# Patient Record
Sex: Female | Born: 1975 | Race: Black or African American | Hispanic: No | Smoking: Former smoker
Health system: Southern US, Community
[De-identification: ages and names within clinical notes are randomized; demographics above are authoritative.]

## PROBLEM LIST (undated history)

## (undated) DIAGNOSIS — I10 Essential (primary) hypertension: Secondary | ICD-10-CM

---

## 2021-06-17 ENCOUNTER — Emergency Department
Admission: EM | Admit: 2021-06-17 | Discharge: 2021-06-17 | Disposition: A | Discharge status: Home / Self Care | Payer: 59 | Attending: Emergency Medicine | Admitting: Emergency Medicine

## 2021-06-17 ENCOUNTER — Emergency Department: Payer: 59

## 2021-06-17 ENCOUNTER — Other Ambulatory Visit: Payer: Self-pay

## 2021-06-17 ENCOUNTER — Encounter: Payer: Self-pay | Admitting: *Deleted

## 2021-06-17 ENCOUNTER — Ambulatory Visit: Admission: EM | Admit: 2021-06-17 | Discharge: 2021-06-17 | Payer: 59

## 2021-06-17 DIAGNOSIS — T59811A Toxic effect of smoke, accidental (unintentional), initial encounter: Secondary | ICD-10-CM

## 2021-06-17 DIAGNOSIS — R0602 Shortness of breath: Secondary | ICD-10-CM

## 2021-06-17 DIAGNOSIS — I10 Essential (primary) hypertension: Secondary | ICD-10-CM | POA: Insufficient documentation

## 2021-06-17 DIAGNOSIS — F17200 Nicotine dependence, unspecified, uncomplicated: Secondary | ICD-10-CM | POA: Insufficient documentation

## 2021-06-17 DIAGNOSIS — J705 Respiratory conditions due to smoke inhalation: Secondary | ICD-10-CM | POA: Diagnosis not present

## 2021-06-17 LAB — BLOOD GAS, VENOUS
Acid-Base Excess: 1.3 mmol/L (ref 0.0–2.0)
Bicarbonate: 26.6 mmol/L (ref 20.0–28.0)
O2 Saturation: 71.5 %
Patient temperature: 37
pCO2, Ven: 44 mmHg (ref 44.0–60.0)
pH, Ven: 7.39 (ref 7.250–7.430)
pO2, Ven: 38 mmHg (ref 32.0–45.0)

## 2021-06-17 LAB — BASIC METABOLIC PANEL
Anion gap: 9 (ref 5–15)
BUN: 10 mg/dL (ref 6–20)
CO2: 23 mmol/L (ref 22–32)
Calcium: 9.2 mg/dL (ref 8.9–10.3)
Chloride: 104 mmol/L (ref 98–111)
Creatinine, Ser: 0.73 mg/dL (ref 0.44–1.00)
GFR, Estimated: >60 mL/min (ref >60)
Glucose, Bld: 103 mg/dL — ABNORMAL HIGH (ref 70–99)
Potassium: 3.7 mmol/L (ref 3.5–5.1)
Sodium: 136 mmol/L (ref 135–145)

## 2021-06-17 LAB — CBC
HCT: 35.7 % — ABNORMAL LOW (ref 36.0–46.0)
Hemoglobin: 12.1 g/dL (ref 12.0–15.0)
MCH: 30.3 pg (ref 26.0–34.0)
MCHC: 33.9 g/dL (ref 30.0–36.0)
MCV: 89.3 fL (ref 80.0–100.0)
Platelets: 308 10*3/uL (ref 150–400)
RBC: 4 MIL/uL (ref 3.87–5.11)
RDW: 12.9 % (ref 11.5–15.5)
WBC: 7.9 10*3/uL (ref 4.0–10.5)
nRBC: 0 % (ref 0.0–0.2)

## 2021-06-17 LAB — TROPONIN I (HIGH SENSITIVITY): Troponin I (High Sensitivity): 5 ng/L (ref <18)

## 2021-06-17 LAB — LACTIC ACID, PLASMA: Lactic Acid, Venous: 0.9 mmol/L (ref 0.5–1.9)

## 2021-06-17 NOTE — Discharge Instructions (Addendum)
Your blood pressure was slightly elevated today.  This could be from stressful events earlier today but please have this rechecked by your primary care physician in the next 7 to 10 days.
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
## 2021-06-17 NOTE — Progress Notes (Signed)
Co-oximetry results did not cross over to Epic.   tHb: 12.4 COHb: 1.6 MetHb: 0.0 sO2: 71.5 Hct: 37  Results verbally given to MD

## 2021-06-18 LAB — COOXEMETRY PANEL
Carboxyhemoglobin: 1.6 % — ABNORMAL HIGH (ref 0.5–1.5)
Methemoglobin: 0 % (ref 0.0–1.5)
O2 Saturation: 71.5 %
Total hemoglobin: 12.4 g/dL (ref 12.0–16.0)
Total oxygen content: 37 mL/dL

## 2021-10-26 ENCOUNTER — Other Ambulatory Visit: Payer: Self-pay

## 2021-10-26 ENCOUNTER — Emergency Department: Payer: 59

## 2021-10-26 ENCOUNTER — Encounter: Payer: Self-pay | Admitting: Emergency Medicine

## 2021-10-26 ENCOUNTER — Emergency Department
Admission: EM | Admit: 2021-10-26 | Discharge: 2021-10-26 | Disposition: A | Discharge status: Home / Self Care | Payer: 59 | Attending: Student in an Organized Health Care Education/Training Program | Admitting: Student in an Organized Health Care Education/Training Program

## 2021-10-26 DIAGNOSIS — R0602 Shortness of breath: Secondary | ICD-10-CM | POA: Diagnosis not present

## 2021-10-26 DIAGNOSIS — I10 Essential (primary) hypertension: Secondary | ICD-10-CM | POA: Insufficient documentation

## 2021-10-26 DIAGNOSIS — R0789 Other chest pain: Secondary | ICD-10-CM | POA: Insufficient documentation

## 2021-10-26 DIAGNOSIS — R079 Chest pain, unspecified: Secondary | ICD-10-CM | POA: Diagnosis present

## 2021-10-26 HISTORY — DX: Essential (primary) hypertension: I10

## 2021-10-26 LAB — CBC WITH DIFFERENTIAL/PLATELET
Abs Immature Granulocytes: 0.03 10*3/uL (ref 0.00–0.07)
Basophils Absolute: 0 10*3/uL (ref 0.0–0.1)
Basophils Relative: 1 %
Eosinophils Absolute: 0.1 10*3/uL (ref 0.0–0.5)
Eosinophils Relative: 1 %
HCT: 36.2 % (ref 36.0–46.0)
Hemoglobin: 11.7 g/dL — ABNORMAL LOW (ref 12.0–15.0)
Immature Granulocytes: 0 %
Lymphocytes Relative: 32 %
Lymphs Abs: 2.4 10*3/uL (ref 0.7–4.0)
MCH: 29.5 pg (ref 26.0–34.0)
MCHC: 32.3 g/dL (ref 30.0–36.0)
MCV: 91.4 fL (ref 80.0–100.0)
Monocytes Absolute: 0.7 10*3/uL (ref 0.1–1.0)
Monocytes Relative: 10 %
Neutro Abs: 4.3 10*3/uL (ref 1.7–7.7)
Neutrophils Relative %: 56 %
Platelets: 289 10*3/uL (ref 150–400)
RBC: 3.96 MIL/uL (ref 3.87–5.11)
RDW: 13 % (ref 11.5–15.5)
WBC: 7.6 10*3/uL (ref 4.0–10.5)
nRBC: 0 % (ref 0.0–0.2)

## 2021-10-26 LAB — BRAIN NATRIURETIC PEPTIDE: B Natriuretic Peptide: 359.4 pg/mL — ABNORMAL HIGH (ref 0.0–100.0)

## 2021-10-26 LAB — TROPONIN I (HIGH SENSITIVITY)
Troponin I (High Sensitivity): 4 ng/L (ref <18)
Troponin I (High Sensitivity): 4 ng/L (ref <18)
Troponin I (High Sensitivity): 5 ng/L (ref <18)

## 2021-10-26 LAB — COMPREHENSIVE METABOLIC PANEL
ALT: 16 U/L (ref 0–44)
AST: 18 U/L (ref 15–41)
Albumin: 3.5 g/dL (ref 3.5–5.0)
Alkaline Phosphatase: 47 U/L (ref 38–126)
Anion gap: 6 (ref 5–15)
BUN: 11 mg/dL (ref 6–20)
CO2: 23 mmol/L (ref 22–32)
Calcium: 8.8 mg/dL — ABNORMAL LOW (ref 8.9–10.3)
Chloride: 107 mmol/L (ref 98–111)
Creatinine, Ser: 0.71 mg/dL (ref 0.44–1.00)
GFR, Estimated: >60 mL/min (ref >60)
Glucose, Bld: 129 mg/dL — ABNORMAL HIGH (ref 70–99)
Potassium: 4.1 mmol/L (ref 3.5–5.1)
Sodium: 136 mmol/L (ref 135–145)
Total Bilirubin: 0.6 mg/dL (ref 0.3–1.2)
Total Protein: 7.5 g/dL (ref 6.5–8.1)

## 2021-10-26 LAB — D-DIMER, QUANTITATIVE: D-Dimer, Quant: 0.43 ug/mL-FEU (ref 0.00–0.50)

## 2021-10-26 MED ORDER — SUCRALFATE 1 G PO TABS
1.0000 g | ORAL_TABLET | Freq: Once | ORAL | Status: AC
  Administered 2021-10-26: 1 g via ORAL
  Filled 2021-10-26: qty 1

## 2021-10-26 MED ORDER — HYDROCHLOROTHIAZIDE 12.5 MG PO TABS: 25.0000 mg | ORAL_TABLET | Freq: Every day | ORAL | 0 refills | Status: DC

## 2021-10-26 MED ORDER — PANTOPRAZOLE SODIUM 40 MG PO TBEC
40.0000 mg | DELAYED_RELEASE_TABLET | Freq: Once | ORAL | Status: AC
  Administered 2021-10-26: 40 mg via ORAL
  Filled 2021-10-26: qty 1

## 2021-10-26 MED ORDER — ALUM & MAG HYDROXIDE-SIMETH 200-200-20 MG/5ML PO SUSP
30.0000 mL | Freq: Once | ORAL | Status: AC
  Administered 2021-10-26: 30 mL via ORAL
  Filled 2021-10-26: qty 30

## 2021-10-26 MED ORDER — PANTOPRAZOLE SODIUM 20 MG PO TBEC
20.0000 mg | DELAYED_RELEASE_TABLET | Freq: Every day | ORAL | 1 refills | Status: AC
Start: 2021-10-26 — End: 2022-10-26

## 2021-10-26 MED ORDER — OXYCODONE-ACETAMINOPHEN 5-325 MG PO TABS
1.0000 | ORAL_TABLET | Freq: Once | ORAL | Status: AC
  Administered 2021-10-26: 1 via ORAL
  Filled 2021-10-26: qty 1

## 2021-10-26 MED ORDER — LIDOCAINE VISCOUS HCL 2 % MT SOLN
15.0000 mL | Freq: Once | OROMUCOSAL | Status: AC
  Administered 2021-10-26: 15 mL via ORAL
  Filled 2021-10-26: qty 15

## 2021-10-26 NOTE — ED Triage Notes (Signed)
Pt via POV from home. Pt c/o mid to R sided CP that started this AM. Pt also endorses SOB. Pain is non-radiating. Pt states this has happened before and they told her it was acid reflux but states this is worse. Pt is A&Ox4 and NAD

## 2021-10-26 NOTE — ED Provider Notes (Signed)
[facility] Provider Note    Event Date/Time   First MD Initiated Contact with Patient 10/26/21 1708     (approximate)   History   Chest Pain   HPI  Ashlee Adams is a 46 y.o. female presents the ER for evaluation of chest pain and pressure worse with taking deep inspiration nonradiating to her back or shoulders.  No fevers or chills.  Symptoms started this morning.  States she has had chest pain in the past was told it was acid reflux.  This feels different as compared to prior.  Never increasing stressors at home no history of heart disease or lung disease.     Physical Exam   Triage Vital Signs: ED Triage Vitals  Enc Vitals Group     BP 10/26/21 1552 (!) 145/81     Pulse Rate 10/26/21 1552 85     Resp 10/26/21 1552 18     Temp 10/26/21 1552 98.1 F (36.7 C)     Temp Source 10/26/21 1552 Oral     SpO2 10/26/21 1552 98 %     Weight 10/26/21 1550 210 lb (95.3 kg)     Height 10/26/21 1550 5\' 1"  (1.549 m)     Head Circumference --      Peak Flow --      Pain Score 10/26/21 1550 10     Pain Loc --      Pain Edu? --      Excl. in GC? --     Most recent vital signs: Vitals:   10/26/21 1552 10/26/21 1910  BP: (!) 145/81 121/63  Pulse: 85 70  Resp: 18 20  Temp: 98.1 F (36.7 C)   SpO2: 98% 98%     Constitutional: Alert  Eyes: Conjunctivae are normal.  Head: Atraumatic. Nose: No congestion/rhinnorhea. Mouth/Throat: Mucous membranes are moist.   Neck: Painless ROM.  Cardiovascular:   Good peripheral circulation. Respiratory: Normal respiratory effort.  No retractions.  Gastrointestinal: Soft and nontender.  Musculoskeletal:  no deformity Neurologic:  MAE spontaneously. No gross focal neurologic deficits are appreciated.  Skin:  Skin is warm, dry and intact. No rash noted. Psychiatric: Mood and affect are normal. Speech and behavior are normal.    ED Results / Procedures / Treatments   Labs (all labs ordered are listed, but  only abnormal results are displayed) Labs Reviewed  CBC WITH DIFFERENTIAL/PLATELET - Abnormal; Notable for the following components:      Result Value   Hemoglobin 11.7 (*)    All other components within normal limits  COMPREHENSIVE METABOLIC PANEL - Abnormal; Notable for the following components:   Glucose, Bld 129 (*)    Calcium 8.8 (*)    All other components within normal limits  BRAIN NATRIURETIC PEPTIDE - Abnormal; Notable for the following components:   B Natriuretic Peptide 359.4 (*)    All other components within normal limits  D-DIMER, QUANTITATIVE  TROPONIN I (HIGH SENSITIVITY)  TROPONIN I (HIGH SENSITIVITY)  TROPONIN I (HIGH SENSITIVITY)     EKG  ED ECG REPORT I, Willy EddyPatrick Oberon Hehir, the attending physician, personally viewed and interpreted this ECG.   Date: 10/26/2021  EKG Time: 15:51  Rate: 90  Rhythm: sinus  Axis: normal  Intervals:  normal  ST&T Change: no stemi, no depressions    RADIOLOGY Please see ED Course for my review and interpretation.  I personally reviewed all radiographic images ordered to evaluate for the above acute complaints and reviewed radiology reports and findings.  These findings were personally discussed with the patient.  Please see medical record for radiology report.    PROCEDURES:  Critical Care performed: No  Procedures   MEDICATIONS ORDERED IN ED: Medications  alum & mag hydroxide-simeth (MAALOX/MYLANTA) 200-200-20 MG/5ML suspension 30 mL (30 mLs Oral Given 10/26/21 1757)    And  lidocaine (XYLOCAINE) 2 % viscous mouth solution 15 mL (15 mLs Oral Given 10/26/21 1757)  oxyCODONE-acetaminophen (PERCOCET/ROXICET) 5-325 MG per tablet 1 tablet (1 tablet Oral Given 10/26/21 1755)  pantoprazole (PROTONIX) EC tablet 40 mg (40 mg Oral Given 10/26/21 2130)  sucralfate (CARAFATE) tablet 1 g (1 g Oral Given 10/26/21 2130)     IMPRESSION / MDM / ASSESSMENT AND PLAN / ED COURSE  I reviewed the triage vital signs and the nursing notes.                               Differential diagnosis includes, but is not limited to, ACS, pericarditis, esophagitis, pe, dissection, pna, bronchitis, costochondritis  Patient presents to the ER for evaluation of chest pain and pressure and some shortness of breath as described above.  Has history of heartburn feels somewhat different from previous episodes a little bit more severe.  States that she patient does have high blood pressure but is not on any blood pressure medications.  Does have some pain with deep inspiration.  No history of blood clots.  This presenting complaint could reflect a potentially life-threatening illness therefore the patient will be placed on continuous pulse oximetry and telemetry for monitoring.  Laboratory evaluation will be sent to evaluate for the above complaints.      Clinical Course as of 10/26/21 2133  Mon Oct 26, 2021  1759 Chest x-ray by my interpretation does not show any evidence of pneumothorax.  Does have cardiomegaly. [PR]  1938 Dimer is negative.  Blood pressure improved.  She is not hypoxic.  May have some mild CHF will add on BNP.  No anemia no leukocytosis. [PR]  2117 Patient able to ambulate down the hall and back without any hypoxia or shortness of breath.  She felt significant proved with GI cocktail.  May have some mild CHF but not in severe exacerbation with no hypoxia.  She has outpatient follow-up with her physician already scheduled for this week. [PR]  2128 Patient remains well-appearing in no acute distress.  Repeat troponin is negative.  This not consistent with ACS.  And states that she does have a history of high blood pressure and based on her chest x-ray findings I will put on HCTZ.  We discussed option for hospitalization for further work-up and monitoring but patient states that she feels comfortable saying her PCP and with close outpatient follow-up with cardiology which I think is reasonable given her reassuring work-up.  Discussed strict  return precautions.  Patient agreeable to plan. [PR]    Clinical Course User Index [PR] Willy Eddy, MD    Patient's presentation is most consistent with acute complicated illness / injury requiring diagnostic workup.   FINAL CLINICAL IMPRESSION(S) / ED DIAGNOSES   Final diagnoses:  Atypical chest pain     Rx / DC Orders   ED Discharge Orders          Ordered    Ambulatory referral to Cardiology       Comments: If you have not heard from the Cardiology office within the next 72 hours please call [phone].  10/26/21 2131    hydrochlorothiazide (HYDRODIURIL) 12.5 MG tablet  Daily        10/26/21 2131    pantoprazole (PROTONIX) 20 MG tablet  Daily        10/26/21 2131             Note:  This document was prepared using Dragon voice recognition software and may include unintentional dictation errors.    Willy Eddy, MD 10/26/21 2133

## 2021-10-27 ENCOUNTER — Ambulatory Visit: Payer: 59 | Admitting: Cardiovascular Disease

## 2021-10-27 ENCOUNTER — Encounter: Payer: Self-pay | Admitting: Cardiovascular Disease

## 2021-10-27 VITALS — BP 120/60 | HR 75 | Ht 61.0 in | Wt 209.0 lb

## 2021-10-27 DIAGNOSIS — R079 Chest pain, unspecified: Secondary | ICD-10-CM

## 2021-10-27 DIAGNOSIS — K219 Gastro-esophageal reflux disease without esophagitis: Secondary | ICD-10-CM

## 2021-10-27 NOTE — Progress Notes (Signed)
Cardiology Office Note  Date:  10/27/2021   ID:  Ashlee Adams, DOB 1976-02-26, MRN [id number]  PCP:  [facility]   Chief Complaint  Patient presents with   [facility] follow up     Patient c/o moderate chest pain/pressure and shortness of breath. Medications reviewed by the patient verbally.      HPI:  Ashlee Adams is a 46 year old woman with past medical history of Hypertension Recently seen in the emergency room yesterday with chest pain on inspiration Noted to have elevated BNP D-dimer negative Former smoker No diabetes Who presents by referral from the emergency room for chest pain  Reports that she woke up yesterday with chest discomfort Described a tightness in the chest, felt like it was moving around  Felt like pressure Started after waking up  Was seen in the emergency room, given GI cocktail with improvement of her symptoms Typically on exertion she does not have any chest discomfort Reports having similar symptoms in the past that resolved  Does not have typical anginal symptoms  Very active at baseline, takes care of her aging mother and 14-year-old who are both present on her visit today  Felt ok this AM, slept well last night  Hospital records reviewed, lab work normal including troponins negative  EKG personally reviewed by myself on todays visit Nsr rate 75 bpm, no st or t wave changes   PMH:   has a past medical history of Hypertension.  PSH:    Past Surgical History:  Procedure Laterality Date   CESAREAN SECTION  2017   CESAREAN SECTION  2019    Current Outpatient Medications  Medication Sig Dispense Refill   hydrochlorothiazide (HYDRODIURIL) 12.5 MG tablet Take 2 tablets (25 mg total) by mouth daily for 14 days. 14 tablet 0   JUNEL 1.5/30 1.5-30 MG-MCG tablet Take by mouth.     pantoprazole (PROTONIX) 20 MG tablet Take 1 tablet (20 mg total) by mouth daily. 30 tablet 1   No current facility-administered medications for this  visit.    Allergies:   Patient has no known allergies.   Social History:  The patient  reports that she quit smoking about 2 years ago. Her smoking use included cigarettes. She has a 15.00 pack-year smoking history. She has never used smokeless tobacco. She reports that she does not drink alcohol and does not use drugs.   Family History:   family history includes Diabetes in her mother; Hypertension in her brother, father, mother, sister, sister, and sister; Kidney disease in her mother.    Review of Systems: Review of Systems  Constitutional: Negative.   HENT: Negative.    Respiratory: Negative.    Cardiovascular:  Positive for chest pain.  Gastrointestinal: Negative.   Musculoskeletal: Negative.   Neurological: Negative.   Psychiatric/Behavioral: Negative.    All other systems reviewed and are negative.   PHYSICAL EXAM: VS:  BP 120/60 (BP Location: Right Arm, Patient Position: Sitting, Cuff Size: Normal)   Pulse 75   Ht 5\' 1"  (1.549 m)   Wt 209 lb (94.8 kg)   LMP 10/10/2021 (Approximate)   SpO2 98%   BMI 39.49 kg/m  , BMI Body mass index is 39.49 kg/m. GEN: Well nourished, well developed, in no acute distress HEENT: normal Neck: no JVD, carotid bruits, or masses Cardiac: RRR; no murmurs, rubs, or gallops,no edema  Respiratory:  clear to auscultation bilaterally, normal work of breathing GI: soft, nontender, nondistended, + BS MS: no deformity or atrophy Skin:  warm and dry, no rash Neuro:  Strength and sensation are intact Psych: euthymic mood, full affect  Recent Labs: 10/26/2021: ALT 16; B Natriuretic Peptide 359.4; BUN 11; Creatinine, Ser 0.71; Hemoglobin 11.7; Platelets 289; Potassium 4.1; Sodium 136    Lipid Panel No results found for: CHOL, HDL, LDLCALC, TRIG    Wt Readings from Last 3 Encounters:  10/27/21 209 lb (94.8 kg)  10/26/21 210 lb (95.3 kg)  06/17/21 210 lb (95.3 kg)     ASSESSMENT AND PLAN:  Problem List Items Addressed This Visit    None Visit Diagnoses     Chest pain of uncertain etiology    -  Primary   Relevant Orders   EKG 12-Lead   Gastroesophageal reflux disease, unspecified whether esophagitis present       Relevant Orders   EKG 12-Lead      Chest pain Atypical in nature, improved with GI cocktail in the ER Feels well this morning, cardiac enzymes negative, clinically doing well, EKG normal No significant cardiac risk factors, non-smoker, no diabetes Recommend she pursue her GI cocktail Would recommend she fill prescription for her PPI  Elevated blood pressure Measurement elevated in the ER and was started on HCTZ but blood pressure well controlled today We will hold off on starting the HCTZ  GERD Recommend she check the price of pantoprazole, it is expensive we will change to omeprazole over-the-counter Could also try Tums and Pepcid as needed with carbonated soda for any recurrent symptoms   Total encounter time more than 50 minutes  Greater than 50% was spent in counseling and coordination of care with the patient    Signed, Dossie Arbour, M.D., Ph.D. [facility] [place], [place] [phone]

## 2021-10-27 NOTE — Patient Instructions (Addendum)
Medication Instructions:  Look for omeprazole 20 mg daily, up to 2 if needed  Tums/generic of pepcid (famotidine) Carbonated soda  If you need a refill on your cardiac medications before your next appointment, please call your pharmacy.   Lab work: No new labs needed  Testing/Procedures: No new testing needed  Follow-Up: At [facility], you and your health needs are our priority.  As part of our continuing mission to provide you with exceptional heart care, we have created designated Provider Care Teams.  These Care Teams include your primary Cardiologist (physician) and Advanced Practice Providers (APPs -  Physician Assistants and Nurse Practitioners) who all work together to provide you with the care you need, when you need it.  You will need a follow up appointment as needed  Providers on your designated Care Team:   Nicolasa Ducking, NP Eula Listen, PA-C Cadence Fransico Michael, [place]  COVID-19 Vaccine Information can be found at: [email or web address] For questions related to vaccine distribution or appointments, please email [email or web address] or call [phone].

## 2023-07-04 IMAGING — DX DG CHEST 1V PORT
1 series · 1 of 1 positions shown · non-contrast
Comparison: 06/17/2021

CLINICAL DATA: Chest pain

EXAM:
PORTABLE CHEST 1 VIEW

[chest ap]
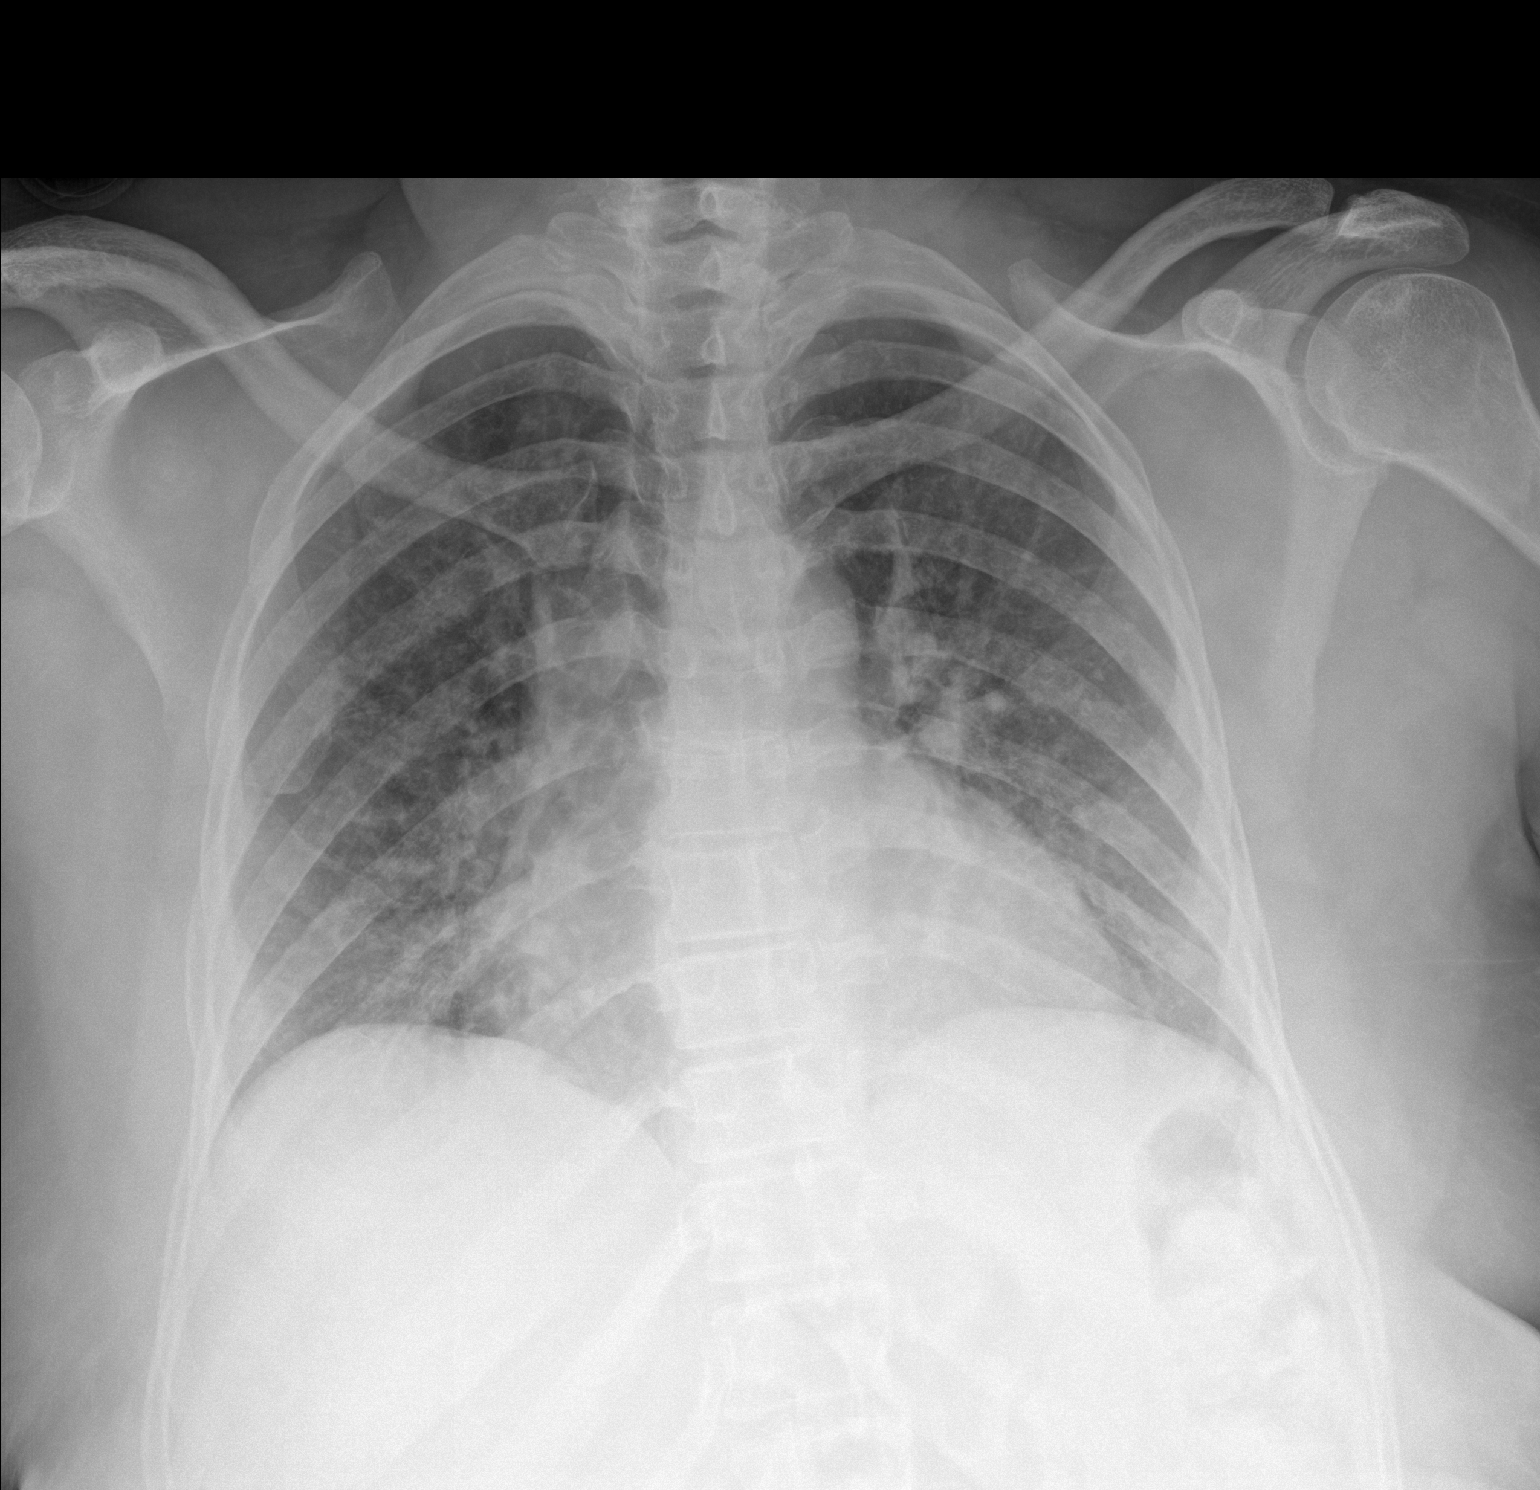

[1 of 1 positions shown; findings below may reference images not displayed]

FINDINGS: Transverse diameter of heart is increased. Central pulmonary vessels
are more prominent. Increased interstitial markings are seen in the
parahilar regions and lower lung fields. Costophrenic angles are
clear. There is no pneumothorax.
IMPRESSION: Cardiomegaly. Central pulmonary vessels are more prominent along
with increased interstitial markings suggesting CHF and interstitial
pulmonary edema.
